# Patient Record
Sex: Female | Born: 1955
Health system: Southern US, Community
[De-identification: ages and names within clinical notes are randomized; demographics above are authoritative.]

## PROBLEM LIST (undated history)

## (undated) DIAGNOSIS — E119 Type 2 diabetes mellitus without complications: Secondary | ICD-10-CM

## (undated) DIAGNOSIS — N200 Calculus of kidney: Secondary | ICD-10-CM

## (undated) DIAGNOSIS — E785 Hyperlipidemia, unspecified: Secondary | ICD-10-CM

## (undated) HISTORY — PX: ABDOMINAL HYSTERECTOMY: SHX81

## (undated) HISTORY — DX: Calculus of kidney: N20.0

## (undated) HISTORY — DX: Type 2 diabetes mellitus without complications: E11.9

## (undated) HISTORY — DX: Hyperlipidemia, unspecified: E78.5

## (undated) HISTORY — PX: CYSTOSCOPY/RETROGRADE/URETEROSCOPY/STONE EXTRACTION WITH BASKET: SHX5317

## (undated) HISTORY — PX: ANTERIOR FUSION CERVICAL SPINE: SUR626

---

## 2014-03-18 ENCOUNTER — Ambulatory Visit: Payer: Self-pay | Admitting: Internal Medicine

## 2015-03-29 ENCOUNTER — Other Ambulatory Visit: Payer: Self-pay | Admitting: Family Medicine

## 2015-03-29 DIAGNOSIS — Z1231 Encounter for screening mammogram for malignant neoplasm of breast: Secondary | ICD-10-CM

## 2015-04-05 ENCOUNTER — Ambulatory Visit
Admission: RE | Admit: 2015-04-05 | Discharge: 2015-04-05 | Disposition: A | Discharge status: Home / Self Care | Payer: 59 | Source: Ambulatory Visit | Attending: Family Medicine | Admitting: Family Medicine

## 2015-04-05 DIAGNOSIS — Z1231 Encounter for screening mammogram for malignant neoplasm of breast: Secondary | ICD-10-CM | POA: Diagnosis not present

## 2016-04-17 ENCOUNTER — Other Ambulatory Visit: Payer: Self-pay | Admitting: Family Medicine

## 2016-04-17 DIAGNOSIS — Z1231 Encounter for screening mammogram for malignant neoplasm of breast: Secondary | ICD-10-CM

## 2016-05-04 ENCOUNTER — Ambulatory Visit
Admission: RE | Admit: 2016-05-04 | Discharge: 2016-05-04 | Disposition: A | Discharge status: Home / Self Care | Payer: Commercial Managed Care - HMO | Source: Ambulatory Visit | Attending: Family Medicine | Admitting: Family Medicine

## 2016-05-04 ENCOUNTER — Other Ambulatory Visit: Payer: Self-pay | Admitting: Family Medicine

## 2016-05-04 DIAGNOSIS — Z1231 Encounter for screening mammogram for malignant neoplasm of breast: Secondary | ICD-10-CM

## 2017-06-25 ENCOUNTER — Other Ambulatory Visit: Payer: Self-pay | Admitting: Family Medicine

## 2017-06-25 DIAGNOSIS — Z1231 Encounter for screening mammogram for malignant neoplasm of breast: Secondary | ICD-10-CM

## 2017-06-27 ENCOUNTER — Ambulatory Visit
Admission: RE | Admit: 2017-06-27 | Discharge: 2017-06-27 | Disposition: A | Discharge status: Home / Self Care | Payer: 59 | Source: Ambulatory Visit | Attending: Family Medicine | Admitting: Family Medicine

## 2017-06-27 DIAGNOSIS — Z1231 Encounter for screening mammogram for malignant neoplasm of breast: Secondary | ICD-10-CM | POA: Insufficient documentation

## 2018-02-06 ENCOUNTER — Ambulatory Visit: Payer: 59 | Admitting: *Deleted

## 2018-02-08 ENCOUNTER — Encounter: Payer: Commercial Managed Care - HMO | Attending: Family Medicine | Admitting: *Deleted

## 2018-02-08 ENCOUNTER — Encounter: Payer: Self-pay | Admitting: *Deleted

## 2018-02-08 VITALS — BP 102/70 | Ht 68.0 in | Wt 142.8 lb

## 2018-02-08 DIAGNOSIS — Z6821 Body mass index (BMI) 21.0-21.9, adult: Secondary | ICD-10-CM | POA: Insufficient documentation

## 2018-02-08 DIAGNOSIS — Z713 Dietary counseling and surveillance: Secondary | ICD-10-CM | POA: Insufficient documentation

## 2018-02-08 DIAGNOSIS — E119 Type 2 diabetes mellitus without complications: Secondary | ICD-10-CM | POA: Diagnosis not present

## 2018-02-08 NOTE — Patient Instructions (Signed)
Check blood sugars 2 x day before breakfast and 2 hrs after supper every day Bring blood sugar records to the next class  Exercise: Continue program for   30-60 minutes 6 days a week  Eat 3 meals day,  1-2  snacks a day Space meals 4-6 hours apart Wait 2-3 hours between meals and snacks  Return for classes on:

## 2018-02-08 NOTE — Progress Notes (Signed)
Diabetes Self-Management Education  Visit Type: First/Initial  Appt. Start Time: 1045 Appt. End Time: 1150  02/08/2018  [name], identified by name and date of birth, is a 62 y.o. female with a diagnosis of Diabetes: Type 2.   ASSESSMENT  Blood pressure 102/70, height 5\' 8"  (1.727 m), weight 142 lb 12.8 oz (64.8 kg). Body mass index is 21.71 kg/m.  Diabetes Self-Management Education - 02/08/18 1232      Visit Information   Visit Type  First/Initial      Initial Visit   Diabetes Type  Type 2    Are you currently following a meal plan?  Yes    What type of meal plan do you follow?  "less sweets and fried foods"    Are you taking your medications as prescribed?  Yes    Date Diagnosed  3 weeks ago      Health Coping   How would you rate your overall health?  Good      Psychosocial Assessment   Patient Belief/Attitude about Diabetes  Other (comment) "confused, somewhat anxious"    Self-care barriers  None    Self-management support  Doctor's office;Family    Patient Concerns  Nutrition/Meal planning;Glycemic Control;Medication;Monitoring;Healthy Lifestyle    Special Needs  None    Preferred Learning Style  Auditory;Visual    Learning Readiness  Change in progress    How often do you need to have someone help you when you read instructions, pamphlets, or other written materials from your doctor or pharmacy?  1 - Never    What is the last grade level you completed in school?  Doctorate      Pre-Education Assessment   Patient understands the diabetes disease and treatment process.  Needs Instruction    Patient understands incorporating nutritional management into lifestyle.  Needs Instruction    Patient undertands incorporating physical activity into lifestyle.  Needs Instruction    Patient understands using medications safely.  Needs Instruction    Patient understands monitoring blood glucose, interpreting and using results  Needs Review    Patient understands  prevention, detection, and treatment of acute complications.  Needs Instruction    Patient understands prevention, detection, and treatment of chronic complications.  Needs Instruction    Patient understands how to develop strategies to address psychosocial issues.  Needs Instruction    Patient understands how to develop strategies to promote health/change behavior.  Needs Instruction      Complications   Last HgB A1C per patient/outside source  12.3 % 01/17/18    How often do you check your blood sugar?  1-2 times/day    Fasting Blood glucose range (mg/dL)  54-098;119-147 Pt reports FBG's 118-132 mg/dL.     Postprandial Blood glucose range (mg/dL)  82-956;213-086 Pt reports pp's 95-150 mg/dL.     Have you had a dilated eye exam in the past 12 months?  Yes    Have you had a dental exam in the past 12 months?  Yes    Are you checking your feet?  Yes    How many days per week are you checking your feet?  7      Dietary Intake   Breakfast  egg whites, oatmeal, occasional bacon; cereal and milk    Lunch  salad with grilled chicken or 1/2 burger    Snack (afternoon)  nuts, fruit    Dinner  baked chicken or fish, sweet potato, pinto beans, corn, rice, pasta, greens, green beans, salads  Beverage(s)  water, unsweetened tea      Exercise   Exercise Type  Light (walking / raking leaves)    How many days per week to you exercise?  6    How many minutes per day do you exercise?  45    Total minutes per week of exercise  270      Patient Education   Previous Diabetes Education  No    Disease state   Definition of diabetes, type 1 and 2, and the diagnosis of diabetes;Factors that contribute to the development of diabetes    Nutrition management   Role of diet in the treatment of diabetes and the relationship between the three main macronutrients and blood glucose level;Reviewed blood glucose goals for pre and post meals and how to evaluate the patients' food intake on their blood glucose  level.;Food label reading, portion sizes and measuring food.    Physical activity and exercise   Role of exercise on diabetes management, blood pressure control and cardiac health.    Medications  Reviewed patients medication for diabetes, action, purpose, timing of dose and side effects.    Monitoring  Purpose and frequency of SMBG.;Taught/discussed recording of test results and interpretation of SMBG.;Identified appropriate SMBG and/or A1C goals.    Chronic complications  Relationship between chronic complications and blood glucose control;Retinopathy and reason for yearly dilated eye exams    Psychosocial adjustment  Identified and addressed patients feelings and concerns about diabetes      Individualized Goals (developed by patient)   Reducing Risk  Improve blood sugars Decrease medications Prevent diabetes complications Lead a healthier lifestyle Become more fit     Outcomes   Expected Outcomes  Demonstrated interest in learning. Expect positive outcomes    Future DMSE  4-6 wks       Individualized Plan for Diabetes Self-Management Training:   Learning Objective:  Patient will have a greater understanding of diabetes self-management. Patient education plan is to attend individual and/or group sessions per assessed needs and concerns.   Plan:   Patient Instructions  Check blood sugars 2 x day before breakfast and 2 hrs after supper every day Bring blood sugar records to the next class Exercise: Continue program for   30-60 minutes 6 days a week Eat 3 meals day,  1-2  snacks a day Space meals 4-6 hours apart Wait 2-3 hours between meals and snacks  Expected Outcomes:  Demonstrated interest in learning. Expect positive outcomes  Education material provided:  General Meal Planning Guidelines Simple Meal Plan  If problems or questions, patient to contact team via:   Sharion SettlerSheila Alaylah Heatherington, RN, CCM, CDE [phone]  Future DSME appointment: 4-6 wks  March 07, 2018 for  Diabetes Class 1

## 2018-03-07 ENCOUNTER — Encounter: Payer: 59 | Attending: Family Medicine | Admitting: Dietician

## 2018-03-07 ENCOUNTER — Encounter: Payer: Self-pay | Admitting: Dietician

## 2018-03-07 VITALS — Wt 140.0 lb

## 2018-03-07 DIAGNOSIS — E119 Type 2 diabetes mellitus without complications: Secondary | ICD-10-CM | POA: Insufficient documentation

## 2018-03-07 DIAGNOSIS — Z713 Dietary counseling and surveillance: Secondary | ICD-10-CM | POA: Insufficient documentation

## 2018-03-07 DIAGNOSIS — Z6821 Body mass index (BMI) 21.0-21.9, adult: Secondary | ICD-10-CM | POA: Diagnosis not present

## 2018-03-07 NOTE — Progress Notes (Signed)

## 2018-03-14 ENCOUNTER — Encounter: Payer: Self-pay | Admitting: *Deleted

## 2018-03-14 ENCOUNTER — Encounter: Payer: 59 | Attending: Family Medicine | Admitting: *Deleted

## 2018-03-14 VITALS — Wt 140.1 lb

## 2018-03-14 DIAGNOSIS — Z713 Dietary counseling and surveillance: Secondary | ICD-10-CM | POA: Insufficient documentation

## 2018-03-14 DIAGNOSIS — E119 Type 2 diabetes mellitus without complications: Secondary | ICD-10-CM | POA: Insufficient documentation

## 2018-03-14 DIAGNOSIS — Z6821 Body mass index (BMI) 21.0-21.9, adult: Secondary | ICD-10-CM | POA: Diagnosis not present

## 2018-03-14 NOTE — Progress Notes (Signed)
Appt. Start Time: 9:00  Appt. End Time: 11:00  Pt had dental appointment and left at 11:00 am  Class 2 Nutritional Management - identify sources of carbohydrate, protein and fat; plan balanced meals; estimate servings of carbohydrates in meals  Psychosocial - identify DM as a source of stress; state the effects of stress on BG control  Exercise - describe the effects of exercise on blood glucose and importance of regular exercise in controlling diabetes; state a plan for personal exercise; verbalize contraindications for exercise  Self-Monitoring - state importance of SMBG; use SMBG results to effectively manage diabetes; identify importance of regular HbA1C testing and goals for results  Acute Complications - recognize hyperglycemia and hypoglycemia with causes and effects; identify blood glucose results as high, low or in control; list steps in treating and preventing high and low blood glucose  Sick Day Guidelines: state appropriate measure to manage blood glucose when ill (need for meds, HBGM plan, when to call physician, need for fluids)  Chronic Complications/Foot, Skin, Eye Dental Care - identify possible long-term complications of diabetes (retinopathy, neuropathy, nephropathy, cardiovascular disease, infections); explain steps in prevention and treatment of chronic complications; state importance of daily self-foot exams; describe how to examine feet and what to look for; explain appropriate eye and dental care  Lifestyle Changes/Goals - state benefits of making appropriate lifestyle changes; identify habits that need to change (meals, tobacco, alcohol); identify strategies to reduce risk factors for personal health  Pregnancy/Sexual Health - state importance of good blood glucose control in preventing sexual problems (impotence, vaginal dryness, infections, loss of desire)  Teaching Materials Used: Class 2 Slide Packet A1C Pamphlet Foot Care Literature Kidney Test Handout Stroke  Card Quick and "Balanced" Meal Ideas Carb Counting and Meal Planning Book Goals for Class 2

## 2018-03-21 ENCOUNTER — Encounter: Payer: 59 | Admitting: Dietician

## 2018-03-21 ENCOUNTER — Encounter: Payer: Self-pay | Admitting: Dietician

## 2018-03-21 VITALS — BP 100/68 | Ht 68.0 in | Wt 142.7 lb

## 2018-03-21 DIAGNOSIS — Z713 Dietary counseling and surveillance: Secondary | ICD-10-CM | POA: Diagnosis not present

## 2018-03-21 DIAGNOSIS — E119 Type 2 diabetes mellitus without complications: Secondary | ICD-10-CM

## 2018-03-21 NOTE — Progress Notes (Signed)

## 2018-03-22 ENCOUNTER — Encounter: Payer: Self-pay | Admitting: *Deleted

## 2018-06-05 ENCOUNTER — Other Ambulatory Visit: Payer: Self-pay | Admitting: Family Medicine

## 2018-06-05 DIAGNOSIS — Z1231 Encounter for screening mammogram for malignant neoplasm of breast: Secondary | ICD-10-CM

## 2018-07-02 ENCOUNTER — Ambulatory Visit
Admission: RE | Admit: 2018-07-02 | Discharge: 2018-07-02 | Disposition: A | Discharge status: Home / Self Care | Payer: 59 | Source: Ambulatory Visit | Attending: Family Medicine | Admitting: Family Medicine

## 2018-07-02 DIAGNOSIS — Z1231 Encounter for screening mammogram for malignant neoplasm of breast: Secondary | ICD-10-CM | POA: Insufficient documentation

## 2019-03-30 IMAGING — MG MM DIGITAL SCREENING BILAT W/ TOMO W/ CAD
8 series · 9 of 24 positions shown · non-contrast
Comparison: Previous exam(s).

ACR Breast Density Category a: The breast tissue is almost entirely
fatty.

CLINICAL DATA: Screening.

EXAM:
DIGITAL SCREENING BILATERAL MAMMOGRAM WITH TOMO AND CAD

[R MLO synth-2D]
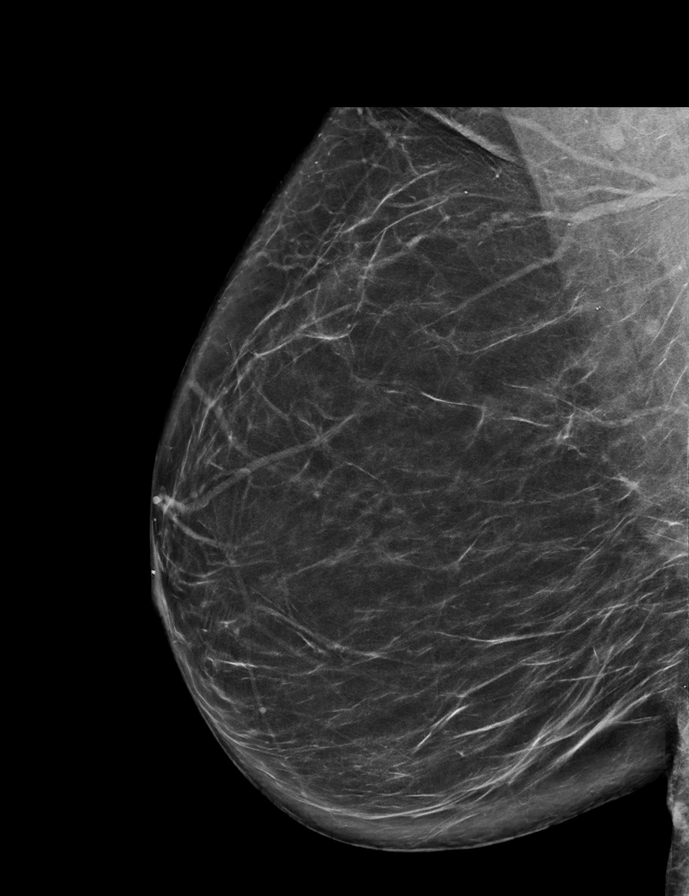

[L MLO synth-2D]
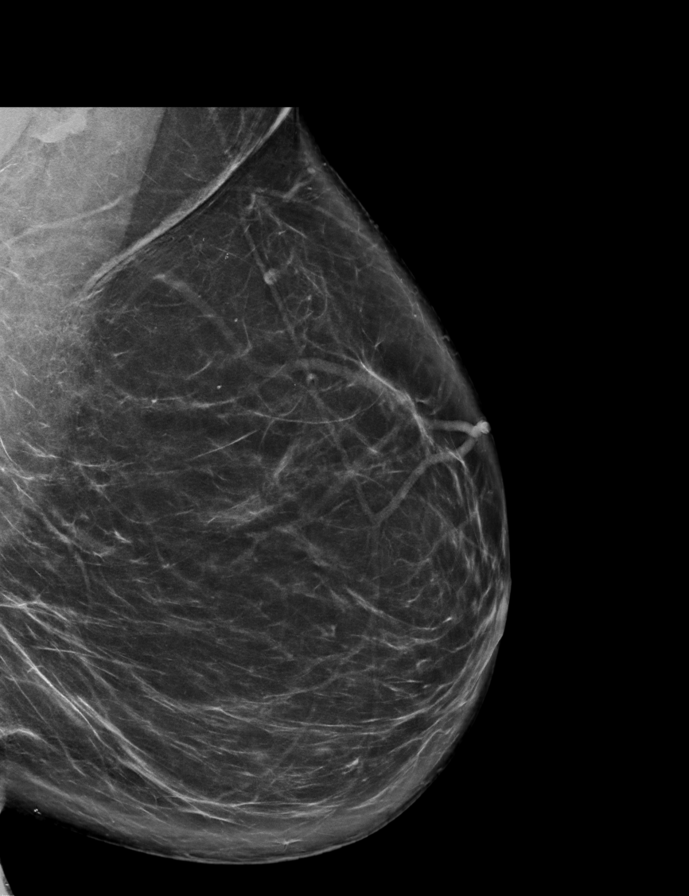

[L CC synth-2D]
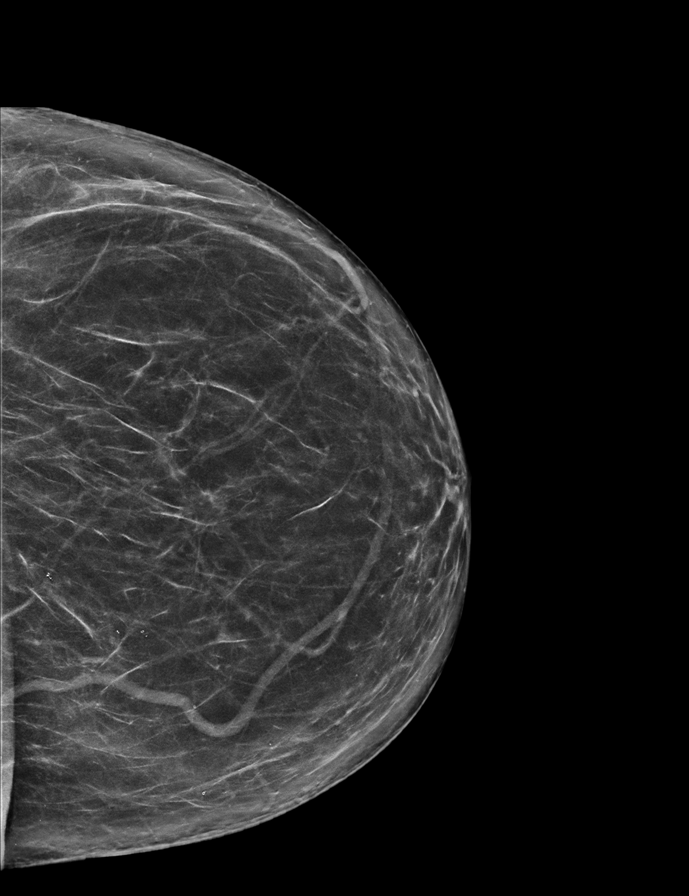

[R CC synth-2D]
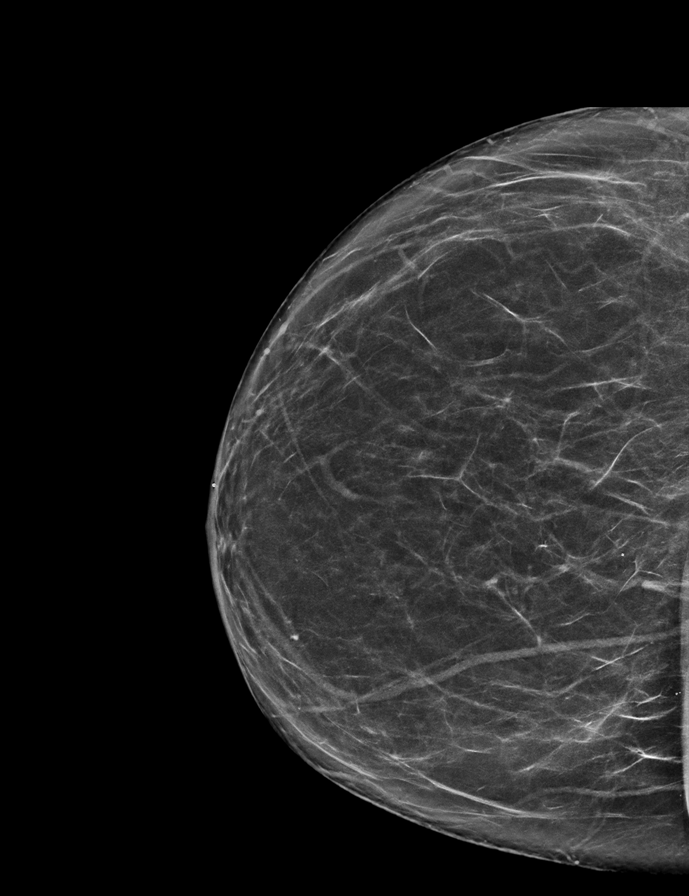

[L MLO tomo · 2 of 85 frames shown]
[frame 28/85]
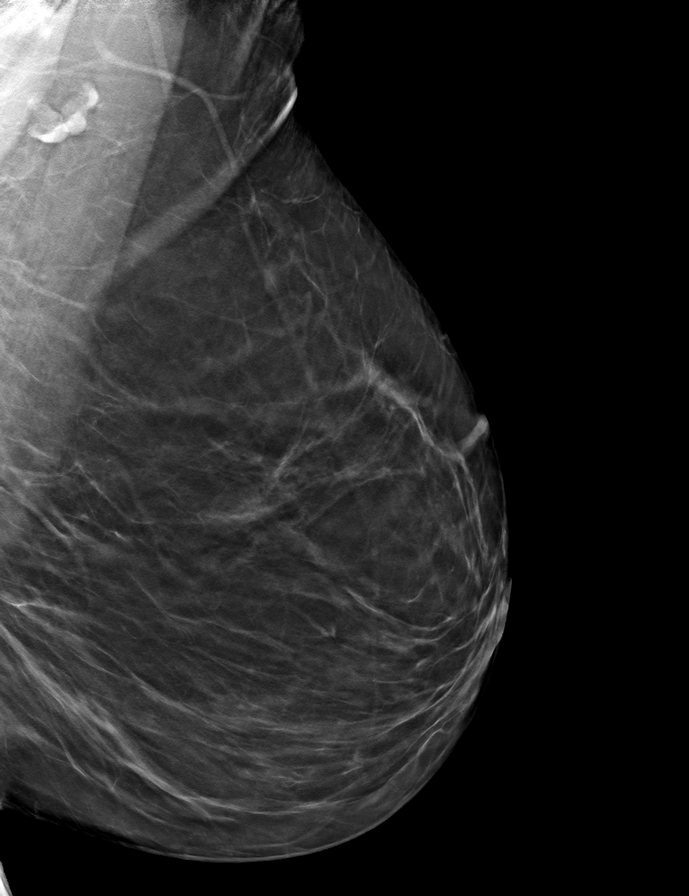
[frame 43/85]
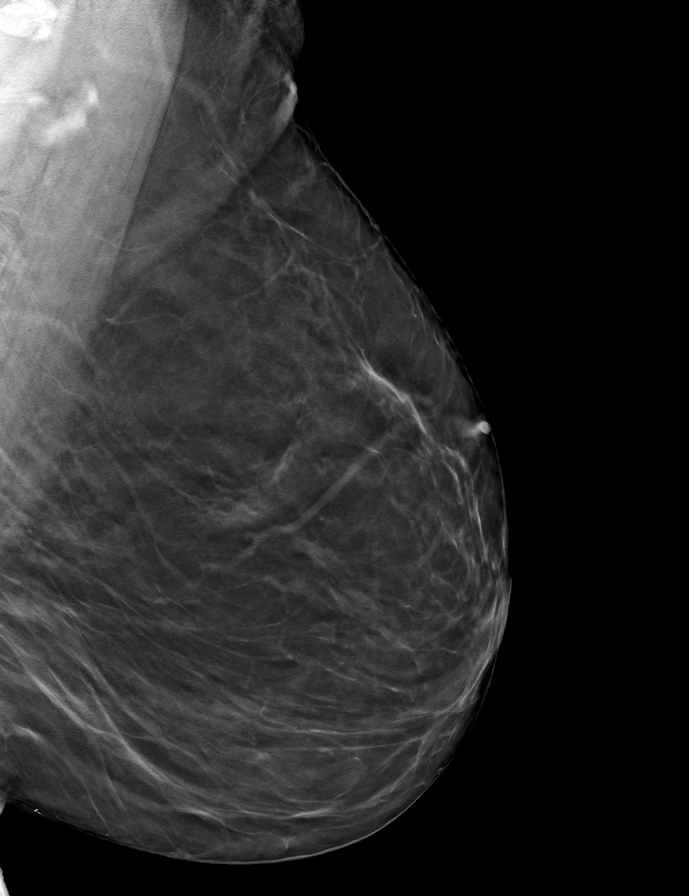

[L CC tomo · tomo slice 36/71.0]
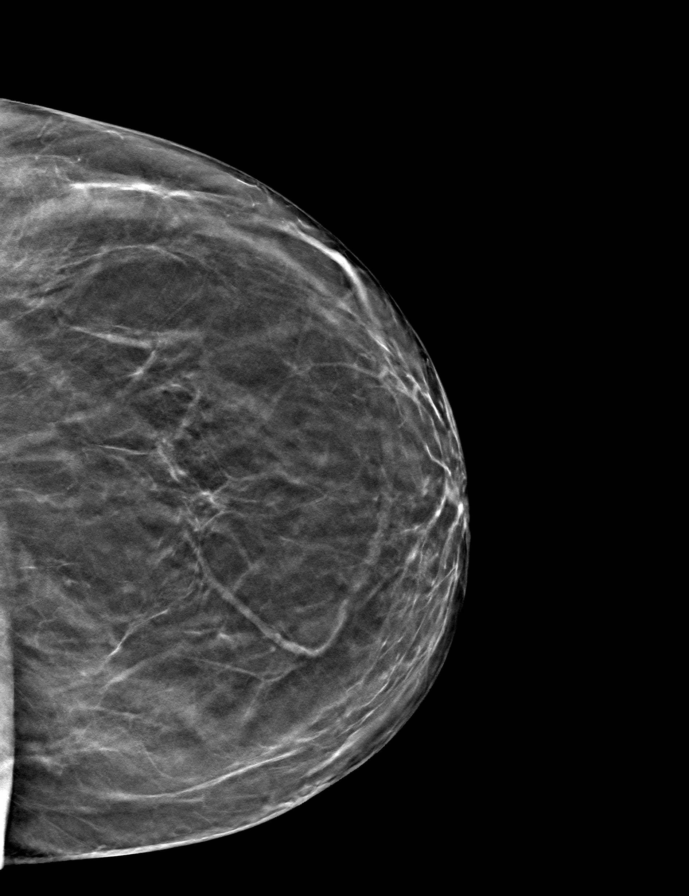

[R MLO tomo · tomo slice 39/76.0]
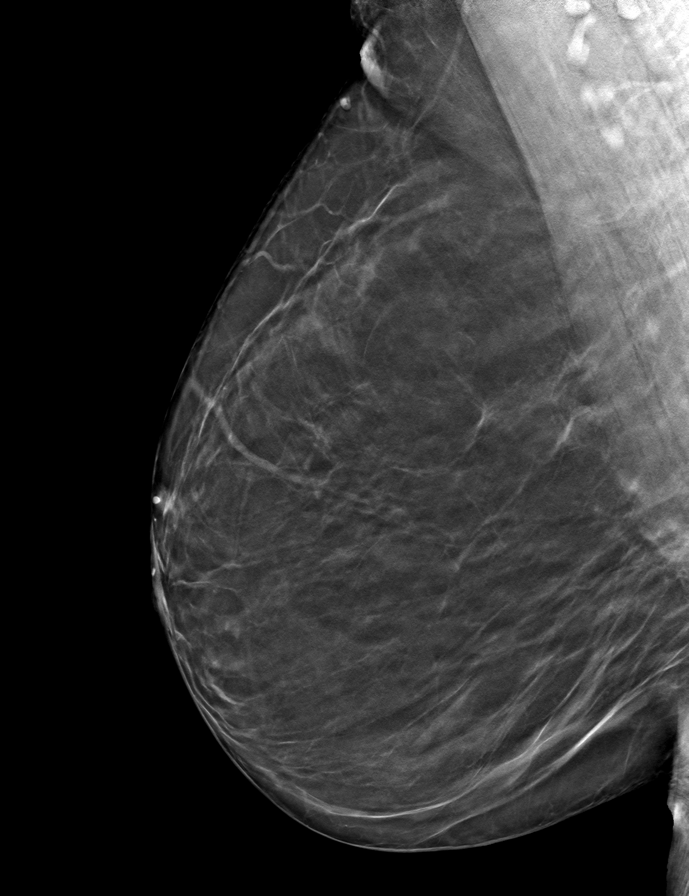

[R CC tomo · tomo slice 36/71.0]
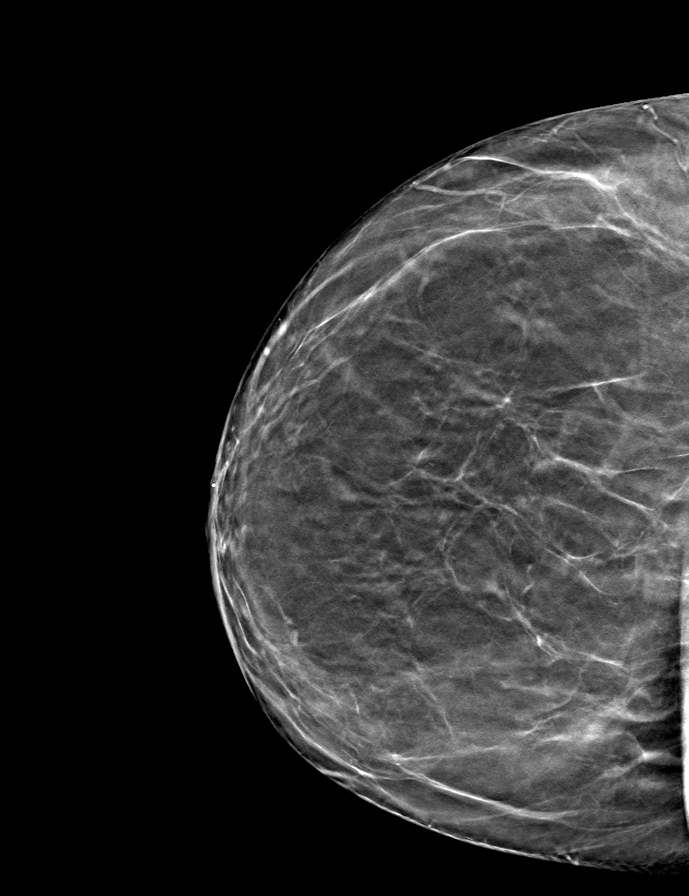

[9 of 24 positions shown; findings below may reference images not displayed]

FINDINGS: There are no findings suspicious for malignancy. Images were
processed with CAD.
IMPRESSION: No mammographic evidence of malignancy. A result letter of this
screening mammogram will be mailed directly to the patient.

RECOMMENDATION:
Screening mammogram in one year. (Code:[id number])

BI-RADS CATEGORY  1: Negative.

## 2019-06-16 ENCOUNTER — Other Ambulatory Visit: Payer: Self-pay | Admitting: Family Medicine

## 2019-06-16 DIAGNOSIS — Z1231 Encounter for screening mammogram for malignant neoplasm of breast: Secondary | ICD-10-CM

## 2019-07-23 ENCOUNTER — Ambulatory Visit
Admission: RE | Admit: 2019-07-23 | Discharge: 2019-07-23 | Disposition: A | Discharge status: Home / Self Care | Payer: 59 | Source: Ambulatory Visit | Attending: Family Medicine | Admitting: Family Medicine

## 2019-07-23 DIAGNOSIS — Z1231 Encounter for screening mammogram for malignant neoplasm of breast: Secondary | ICD-10-CM | POA: Diagnosis not present

## 2019-09-10 ENCOUNTER — Other Ambulatory Visit: Payer: Self-pay

## 2019-09-10 ENCOUNTER — Other Ambulatory Visit: Payer: Self-pay | Admitting: Family Medicine

## 2019-09-10 ENCOUNTER — Ambulatory Visit
Admission: RE | Admit: 2019-09-10 | Discharge: 2019-09-10 | Disposition: A | Discharge status: Home / Self Care | Payer: 59 | Attending: Family Medicine | Admitting: Family Medicine

## 2019-09-10 ENCOUNTER — Ambulatory Visit
Admission: RE | Admit: 2019-09-10 | Discharge: 2019-09-10 | Disposition: A | Discharge status: Home / Self Care | Payer: 59 | Source: Ambulatory Visit | Attending: Family Medicine | Admitting: Family Medicine

## 2019-09-10 DIAGNOSIS — M5417 Radiculopathy, lumbosacral region: Secondary | ICD-10-CM | POA: Diagnosis present

## 2020-01-11 ENCOUNTER — Ambulatory Visit: Payer: 59 | Attending: Internal Medicine

## 2020-01-11 DIAGNOSIS — Z23 Encounter for immunization: Secondary | ICD-10-CM

## 2020-01-11 NOTE — Progress Notes (Signed)
   Covid-19 Vaccination Clinic  Name:  Gabrielle West    MRN: [id number] DOB: May 29, 1956  01/11/2020  [name] was observed post Covid-19 immunization for 15 minutes without incidence. She was provided with Vaccine Information Sheet and instruction to access the V-Safe system.   [name] was instructed to call 911 with any severe reactions post vaccine: Marland Kitchen Difficulty breathing  . Swelling of your face and throat  . A fast heartbeat  . A bad rash all over your body  . Dizziness and weakness    Immunizations Administered    Name Date Dose VIS Date Route   Pfizer COVID-19 Vaccine 01/11/2020  4:35 PM 0.3 mL 10/24/2019 Intramuscular   Manufacturer: [employer], [employer]   Lot: [id number]   NDC: [address]-5

## 2020-02-03 ENCOUNTER — Ambulatory Visit: Payer: 59 | Attending: Internal Medicine

## 2020-02-03 DIAGNOSIS — Z23 Encounter for immunization: Secondary | ICD-10-CM

## 2020-02-03 NOTE — Progress Notes (Signed)
   Covid-19 Vaccination Clinic  Name:  Gabrielle West    MRN: [id number] DOB: Nov 01, 1956  02/03/2020  [name] was observed post Covid-19 immunization for 15 minutes without incident. She was provided with Vaccine Information Sheet and instruction to access the V-Safe system.   [name] was instructed to call 911 with any severe reactions post vaccine: Marland Kitchen Difficulty breathing  . Swelling of face and throat  . A fast heartbeat  . A bad rash all over body  . Dizziness and weakness   Immunizations Administered    Name Date Dose VIS Date Route   Pfizer COVID-19 Vaccine 02/03/2020  8:51 AM 0.3 mL 10/24/2019 Intramuscular   Manufacturer: [employer], [employer]   Lot: [id number]   NDC: [address]-9

## 2020-03-22 ENCOUNTER — Other Ambulatory Visit: Payer: Self-pay

## 2020-03-22 ENCOUNTER — Emergency Department
Admission: EM | Admit: 2020-03-22 | Discharge: 2020-03-22 | Disposition: A | Discharge status: Home / Self Care | Payer: 59 | Attending: Emergency Medicine | Admitting: Emergency Medicine

## 2020-03-22 ENCOUNTER — Emergency Department: Payer: 59

## 2020-03-22 DIAGNOSIS — R0602 Shortness of breath: Secondary | ICD-10-CM | POA: Insufficient documentation

## 2020-03-22 DIAGNOSIS — Z5321 Procedure and treatment not carried out due to patient leaving prior to being seen by health care provider: Secondary | ICD-10-CM | POA: Insufficient documentation

## 2020-03-22 DIAGNOSIS — R0789 Other chest pain: Secondary | ICD-10-CM | POA: Insufficient documentation

## 2020-03-22 LAB — TROPONIN I (HIGH SENSITIVITY): Troponin I (High Sensitivity): <2 ng/L (ref <18)

## 2020-03-22 LAB — BASIC METABOLIC PANEL
Anion gap: 7 (ref 5–15)
BUN: 17 mg/dL (ref 8–23)
CO2: 27 mmol/L (ref 22–32)
Calcium: 9.3 mg/dL (ref 8.9–10.3)
Chloride: 105 mmol/L (ref 98–111)
Creatinine, Ser: 0.67 mg/dL (ref 0.44–1.00)
GFR calc Af Amer: >60 mL/min (ref >60)
GFR calc non Af Amer: >60 mL/min (ref >60)
Glucose, Bld: 167 mg/dL — ABNORMAL HIGH (ref 70–99)
Potassium: 4.1 mmol/L (ref 3.5–5.1)
Sodium: 139 mmol/L (ref 135–145)

## 2020-03-22 LAB — CBC
HCT: 38.4 % (ref 36.0–46.0)
Hemoglobin: 12.7 g/dL (ref 12.0–15.0)
MCH: 29.5 pg (ref 26.0–34.0)
MCHC: 33.1 g/dL (ref 30.0–36.0)
MCV: 89.1 fL (ref 80.0–100.0)
Platelets: 174 10*3/uL (ref 150–400)
RBC: 4.31 MIL/uL (ref 3.87–5.11)
RDW: 12.2 % (ref 11.5–15.5)
WBC: 5.2 10*3/uL (ref 4.0–10.5)
nRBC: 0 % (ref 0.0–0.2)

## 2020-03-22 MED ORDER — SODIUM CHLORIDE 0.9% FLUSH: 3.0000 mL | Freq: Once | INTRAVENOUS | Status: DC

## 2020-03-22 NOTE — ED Triage Notes (Signed)
Pt has chest pain for 24 hours.  Pain in center of chest.    No n/v/d.  Pt has intermittent sob.  Pt alert  Speech clear.

## 2020-03-23 ENCOUNTER — Telehealth: Payer: Self-pay | Admitting: Emergency Medicine

## 2020-03-23 NOTE — Telephone Encounter (Signed)
Called patient due to lwot to inquire about condition and follow up plans. Left message.   

## 2020-06-01 ENCOUNTER — Other Ambulatory Visit: Payer: Self-pay | Admitting: Family Medicine

## 2020-06-01 DIAGNOSIS — Z1231 Encounter for screening mammogram for malignant neoplasm of breast: Secondary | ICD-10-CM

## 2020-09-03 ENCOUNTER — Ambulatory Visit
Admission: RE | Admit: 2020-09-03 | Discharge: 2020-09-03 | Disposition: A | Discharge status: Home / Self Care | Payer: 59 | Source: Ambulatory Visit | Attending: Family Medicine | Admitting: Family Medicine

## 2020-09-03 ENCOUNTER — Other Ambulatory Visit: Payer: Self-pay

## 2020-09-03 DIAGNOSIS — Z1231 Encounter for screening mammogram for malignant neoplasm of breast: Secondary | ICD-10-CM | POA: Insufficient documentation

## 2021-05-03 ENCOUNTER — Ambulatory Visit: Payer: 59 | Admitting: Podiatry

## 2021-05-03 ENCOUNTER — Other Ambulatory Visit: Payer: Self-pay

## 2021-05-03 ENCOUNTER — Encounter: Payer: Self-pay | Admitting: Podiatry

## 2021-05-03 ENCOUNTER — Ambulatory Visit (INDEPENDENT_AMBULATORY_CARE_PROVIDER_SITE_OTHER): Payer: 59

## 2021-05-03 DIAGNOSIS — M722 Plantar fascial fibromatosis: Secondary | ICD-10-CM | POA: Diagnosis not present

## 2021-05-03 MED ORDER — MELOXICAM 15 MG PO TABS: 15.0000 mg | ORAL_TABLET | Freq: Every day | ORAL | 3 refills | Status: DC

## 2021-05-03 NOTE — Patient Instructions (Addendum)
Look for urea 40% with salicylic acid cream or ointment and apply to the thickened dry skin / calluses. This can be bought over the counter, at a pharmacy or online such as [employer].   Plantar Fasciitis (Heel Spur Syndrome) with Rehab The plantar fascia is a fibrous, ligament-like, soft-tissue structure that spans the bottom of the foot. Plantar fasciitis is a condition that causes pain in the foot due to inflammation of the tissue. SYMPTOMS  Pain and tenderness on the underneath side of the foot. Pain that worsens with standing or walking. CAUSES  Plantar fasciitis is caused by irritation and injury to the plantar fascia on the underneath side of the foot. Common mechanisms of injury include: Direct trauma to bottom of the foot. Damage to a small nerve that runs under the foot where the main fascia attaches to the heel bone. Stress placed on the plantar fascia due to bone spurs. RISK INCREASES WITH:  Activities that place stress on the plantar fascia (running, jumping, pivoting, or cutting). Poor strength and flexibility. Improperly fitted shoes. Tight calf muscles. Flat feet. Failure to warm-up properly before activity. Obesity. PREVENTION Warm up and stretch properly before activity. Allow for adequate recovery between workouts. Maintain physical fitness: Strength, flexibility, and endurance. Cardiovascular fitness. Maintain a health body weight. Avoid stress on the plantar fascia. Wear properly fitted shoes, including arch supports for individuals who have flat feet.  PROGNOSIS  If treated properly, then the symptoms of plantar fasciitis usually resolve without surgery. However, occasionally surgery is necessary.  RELATED COMPLICATIONS  Recurrent symptoms that may result in a chronic condition. Problems of the lower back that are caused by compensating for the injury, such as limping. Pain or weakness of the foot during push-off following surgery. Chronic inflammation,  scarring, and partial or complete fascia tear, occurring more often from repeated injections.  TREATMENT  Treatment initially involves the use of ice and medication to help reduce pain and inflammation. The use of strengthening and stretching exercises may help reduce pain with activity, especially stretches of the Achilles tendon. These exercises may be performed at home or with a therapist. Your caregiver may recommend that you use heel cups of arch supports to help reduce stress on the plantar fascia. Occasionally, corticosteroid injections are given to reduce inflammation. If symptoms persist for greater than 6 months despite non-surgical (conservative), then surgery may be recommended.   MEDICATION  If pain medication is necessary, then nonsteroidal anti-inflammatory medications, such as aspirin and ibuprofen, or other minor pain relievers, such as acetaminophen, are often recommended. Do not take pain medication within 7 days before surgery. Prescription pain relievers may be given if deemed necessary by your caregiver. Use only as directed and only as much as you need. Corticosteroid injections may be given by your caregiver. These injections should be reserved for the most serious cases, because they may only be given a certain number of times.  HEAT AND COLD Cold treatment (icing) relieves pain and reduces inflammation. Cold treatment should be applied for 10 to 15 minutes every 2 to 3 hours for inflammation and pain and immediately after any activity that aggravates your symptoms. Use ice packs or massage the area with a piece of ice (ice massage). Heat treatment may be used prior to performing the stretching and strengthening activities prescribed by your caregiver, physical therapist, or athletic trainer. Use a heat pack or soak the injury in warm water.  SEEK IMMEDIATE MEDICAL CARE IF: Treatment seems to offer no benefit, or the  condition worsens. Any medications produce adverse side  effects.  EXERCISES- RANGE OF MOTION (ROM) AND STRETCHING EXERCISES - Plantar Fasciitis (Heel Spur Syndrome) These exercises may help you when beginning to rehabilitate your injury. Your symptoms may resolve with or without further involvement from your physician, physical therapist or athletic trainer. While completing these exercises, remember:  Restoring tissue flexibility helps normal motion to return to the joints. This allows healthier, less painful movement and activity. An effective stretch should be held for at least 30 seconds. A stretch should never be painful. You should only feel a gentle lengthening or release in the stretched tissue.  RANGE OF MOTION - Toe Extension, Flexion Sit with your right / left leg crossed over your opposite knee. Grasp your toes and gently pull them back toward the top of your foot. You should feel a stretch on the bottom of your toes and/or foot. Hold this stretch for 10 seconds. Now, gently pull your toes toward the bottom of your foot. You should feel a stretch on the top of your toes and or foot. Hold this stretch for 10 seconds. Repeat  times. Complete this stretch 3 times per day.   RANGE OF MOTION - Ankle Dorsiflexion, Active Assisted Remove shoes and sit on a chair that is preferably not on a carpeted surface. Place right / left foot under knee. Extend your opposite leg for support. Keeping your heel down, slide your right / left foot back toward the chair until you feel a stretch at your ankle or calf. If you do not feel a stretch, slide your bottom forward to the edge of the chair, while still keeping your heel down. Hold this stretch for 10 seconds. Repeat 3 times. Complete this stretch 2 times per day.   STRETCH  Gastroc, Standing Place hands on wall. Extend right / left leg, keeping the front knee somewhat bent. Slightly point your toes inward on your back foot. Keeping your right / left heel on the floor and your knee straight, shift  your weight toward the wall, not allowing your back to arch. You should feel a gentle stretch in the right / left calf. Hold this position for 10 seconds. Repeat 3 times. Complete this stretch 2 times per day.  STRETCH  Soleus, Standing Place hands on wall. Extend right / left leg, keeping the other knee somewhat bent. Slightly point your toes inward on your back foot. Keep your right / left heel on the floor, bend your back knee, and slightly shift your weight over the back leg so that you feel a gentle stretch deep in your back calf. Hold this position for 10 seconds. Repeat 3 times. Complete this stretch 2 times per day.  STRETCH  Gastrocsoleus, Standing  Note: This exercise can place a lot of stress on your foot and ankle. Please complete this exercise only if specifically instructed by your caregiver.  Place the ball of your right / left foot on a step, keeping your other foot firmly on the same step. Hold on to the wall or a rail for balance. Slowly lift your other foot, allowing your body weight to press your heel down over the edge of the step. You should feel a stretch in your right / left calf. Hold this position for 10 seconds. Repeat this exercise with a slight bend in your right / left knee. Repeat 3 times. Complete this stretch 2 times per day.   STRENGTHENING EXERCISES - Plantar Fasciitis (Heel Spur Syndrome)  These  exercises may help you when beginning to rehabilitate your injury. They may resolve your symptoms with or without further involvement from your physician, physical therapist or athletic trainer. While completing these exercises, remember:  Muscles can gain both the endurance and the strength needed for everyday activities through controlled exercises. Complete these exercises as instructed by your physician, physical therapist or athletic trainer. Progress the resistance and repetitions only as guided.  STRENGTH - Towel Curls Sit in a chair positioned on a  non-carpeted surface. Place your foot on a towel, keeping your heel on the floor. Pull the towel toward your heel by only curling your toes. Keep your heel on the floor. Repeat 3 times. Complete this exercise 2 times per day.  STRENGTH - Ankle Inversion Secure one end of a rubber exercise band/tubing to a fixed object (table, pole). Loop the other end around your foot just before your toes. Place your fists between your knees. This will focus your strengthening at your ankle. Slowly, pull your big toe up and in, making sure the band/tubing is positioned to resist the entire motion. Hold this position for 10 seconds. Have your muscles resist the band/tubing as it slowly pulls your foot back to the starting position. Repeat 3 times. Complete this exercises 2 times per day.  Document Released: 10/30/2005 Document Revised: 01/22/2012 Document Reviewed: 02/11/2009 [facility] Patient Information 2014 [place], [place].

## 2021-05-05 NOTE — Progress Notes (Signed)
  Subjective:  Patient ID: Gabrielle West, female    DOB: [birth year],  MRN: [id number]  Chief Complaint  Patient presents with   Foot Pain    Bilateral foot pain located at the heel of both feet x 1 month. Sharps pain without numbness or tingling.     65 y.o. female presents with the above complaint. History confirmed with patient.  The right side is much worse  Objective:  Physical Exam: warm, good capillary refill, no trophic changes or ulcerative lesions, normal DP and PT pulses, and normal sensory exam. Left Foot: point tenderness over the heel pad  Right Foot: point tenderness over the heel pad   No images are attached to the encounter.  Radiographs: Multiple views x-ray of both feet: no fracture, dislocation, swelling or degenerative changes noted and plantar calcaneal spur Assessment:   1. Plantar fasciitis, bilateral      Plan:  Patient was evaluated and treated and all questions answered.  Discussed the etiology and treatment options for plantar fasciitis including stretching, formal physical therapy, supportive shoegears such as a running shoe or sneaker, pre fabricated orthoses, injection therapy, and oral medications. We also discussed the role of surgical treatment of this for patients who do not improve after exhausting non-surgical treatment options.   -XR reviewed with patient -Educated patient on stretching and icing of the affected limb -Night splint dispensed -Plantar fascial brace dispensed -Injection delivered to the plantar fascia of the right foot. -Rx for meloxicam. Educated on use, risks and benefits of the medication   Return in about 1 month (around 06/02/2021) for recheck plantar fasciitis.

## 2021-06-02 ENCOUNTER — Other Ambulatory Visit: Payer: Self-pay

## 2021-06-02 ENCOUNTER — Ambulatory Visit: Payer: 59 | Admitting: Podiatry

## 2021-06-02 DIAGNOSIS — M722 Plantar fascial fibromatosis: Secondary | ICD-10-CM

## 2021-06-02 MED ORDER — METHYLPREDNISOLONE 4 MG PO TBPK: ORAL_TABLET | ORAL | 0 refills | Status: AC

## 2021-06-02 NOTE — Patient Instructions (Signed)
Look for Tuli's heel cups on [employer] for cushioning    Look for an "EvenUp" shoe attachment on [employer] or at [employer]. This will level out your hips while you are walking in the CAM boot. Wear this on the other foot around a supportive sneaker:

## 2021-06-06 ENCOUNTER — Telehealth: Payer: Self-pay

## 2021-06-06 NOTE — Telephone Encounter (Signed)
FYI: Patient in office today stating that her short CAM boot feels too big. Patient was wearing a large.Had patient try on a tall medium CAM boot which turned out to be a much better fit. Patient was satisfied with the fit of the medium and exchanged it.

## 2021-06-06 NOTE — Telephone Encounter (Signed)
OK - thanks

## 2021-06-07 NOTE — Progress Notes (Signed)
  Subjective:  Patient ID: Gabrielle West, female    DOB: 1956/01/11,  MRN: [id number]  Chief Complaint  Patient presents with   Plantar Fasciitis    1 month follow up  for recheck plantar fasciitis. bilateral    65 y.o. female presents with the above complaint. History confirmed with patient.  Was doing better for a while but has come back.  Objective:  Physical Exam: warm, good capillary refill, no trophic changes or ulcerative lesions, normal DP and PT pulses, and normal sensory exam. Left Foot: Minimal pain on the side Right Foot: point tenderness over the heel pad   No images are attached to the encounter.  Radiographs: Multiple views x-ray of both feet: no fracture, dislocation, swelling or degenerative changes noted and plantar calcaneal spur Assessment:   1. Plantar fasciitis of right foot       Plan:  Patient was evaluated and treated and all questions answered.  Discussed the etiology and treatment options for plantar fasciitis including stretching, formal physical therapy, supportive shoegears such as a running shoe or sneaker, pre fabricated orthoses, injection therapy, and oral medications. We also discussed the role of surgical treatment of this for patients who do not improve after exhausting non-surgical treatment options.  Recommend immobilization in a CAM boot this was dispensed today Methylprednisolone taper sent to pharmacy    Return in about 1 month (around 07/03/2021) for recheck plantar fasciitis.

## 2021-06-29 ENCOUNTER — Other Ambulatory Visit: Payer: Self-pay | Admitting: Family Medicine

## 2021-06-29 DIAGNOSIS — Z0000 Encounter for general adult medical examination without abnormal findings: Secondary | ICD-10-CM

## 2021-06-29 DIAGNOSIS — Z1231 Encounter for screening mammogram for malignant neoplasm of breast: Secondary | ICD-10-CM

## 2021-07-01 DIAGNOSIS — L309 Dermatitis, unspecified: Secondary | ICD-10-CM | POA: Diagnosis not present

## 2021-07-07 ENCOUNTER — Other Ambulatory Visit: Payer: Self-pay

## 2021-07-07 ENCOUNTER — Ambulatory Visit: Payer: 59 | Admitting: Podiatry

## 2021-07-07 DIAGNOSIS — M722 Plantar fascial fibromatosis: Secondary | ICD-10-CM

## 2021-07-11 NOTE — Progress Notes (Signed)
  Subjective:  Patient ID: Gabrielle West, female    DOB: [birth year],  MRN: [id number]  Chief Complaint  Patient presents with   Plantar Fasciitis    4 week follow up right foot    65 y.o. female presents with the above complaint. History confirmed with patient.  Doing much better after being in the boot  Objective:  Physical Exam: warm, good capillary refill, no trophic changes or ulcerative lesions, normal DP and PT pulses, and normal sensory exam. Left Foot: Minimal pain on the side Right Foot: point tenderness over the heel pad   No images are attached to the encounter.  Radiographs: Multiple views x-ray of both feet: no fracture, dislocation, swelling or degenerative changes noted and plantar calcaneal spur Assessment:   No diagnosis found.     Plan:  Patient was evaluated and treated and all questions answered.  Discussed the etiology and treatment options for plantar fasciitis including stretching, formal physical therapy, supportive shoegears such as a running shoe or sneaker, pre fabricated orthoses, injection therapy, and oral medications. We also discussed the role of surgical treatment of this for patients who do not improve after exhausting non-surgical treatment options.   Continues to make good progress I reviewed recommended a repeat injection today and this was performed as per below.  She can resume regular shoe gear and activity as think this will eliminate this with the rest of her home therapy.  Return to see me as needed for this or other issues  After sterile prep with povidone-iodine solution and alcohol, the right heel was injected with 0.5cc 2% xylocaine plain, 0.5cc 0.5% marcaine plain, 5mg  triamcinolone acetonide, and 2mg  dexamethasone was injected along the medial plantar fascia at the insertion on the plantar calcaneus. The patient tolerated the procedure well without complication.     Return if symptoms worsen or fail to improve.

## 2021-08-05 ENCOUNTER — Ambulatory Visit (INDEPENDENT_AMBULATORY_CARE_PROVIDER_SITE_OTHER): Payer: 59 | Admitting: Family Medicine

## 2021-08-05 ENCOUNTER — Encounter: Payer: Self-pay | Admitting: Family Medicine

## 2021-08-05 DIAGNOSIS — M722 Plantar fascial fibromatosis: Secondary | ICD-10-CM

## 2021-08-05 NOTE — Assessment & Plan Note (Signed)
Has already had 2 injections with another provider.  Would like to avoid further injections if I could.  Today we discussed origin of plantar fasciitis and the meaning of a heel spur.  Placed her in green insoles with scaphoid pads.  We discussed potentially adding nitroglycerin patch therapy and she decided to wait till I see her back in 2 to 3 weeks for follow-up.  If she is having significant improvement at that time we may not need to move forward with that.  We also briefly discussed custom molded orthotics.

## 2021-08-05 NOTE — Progress Notes (Signed)
  Gabrielle West - 65 y.o. female MRN [id number]  Date of birth: 04/05/1956    SUBJECTIVE:      Chief Complaint:/ HPI:  Bilateral but right greater than left foot pain.  Has been seeing a podiatrist and he gave her 2 injections in the right foot for plantar fasciitis.  That helped some but still has a lot of pain in the morning.  Initially the left foot was not bothering her now she started to have some pain there.    OBJECTIVE: Ht 5\' 8"  (1.727 m)   Wt 149 lb (67.6 kg)   BMI 22.66 kg/m   Physical Exam:  Vital signs are reviewed. GENERAL: Well-developed female no acute distress FEET: High arched feet.  Tenderness to palpation over the origin of the plantar fascia on the right foot.  Otherwise normal foot exam  ASSESSMENT & PLAN:  See problem based charting & AVS for pt instructions. Plantar fasciitis Has already had 2 injections with another provider.  Would like to avoid further injections if I could.  Today we discussed origin of plantar fasciitis and the meaning of a heel spur.  Placed her in green insoles with scaphoid pads.  We discussed potentially adding nitroglycerin patch therapy and she decided to wait till I see her back in 2 to 3 weeks for follow-up.  If she is having significant improvement at that time we may not need to move forward with that.  We also briefly discussed custom molded orthotics.

## 2021-09-12 ENCOUNTER — Ambulatory Visit
Admission: RE | Admit: 2021-09-12 | Discharge: 2021-09-12 | Disposition: A | Discharge status: Home / Self Care | Payer: 59 | Source: Ambulatory Visit | Attending: Family Medicine | Admitting: Family Medicine

## 2021-09-12 ENCOUNTER — Other Ambulatory Visit: Payer: Self-pay

## 2021-09-12 DIAGNOSIS — Z0000 Encounter for general adult medical examination without abnormal findings: Secondary | ICD-10-CM | POA: Diagnosis not present

## 2021-09-12 DIAGNOSIS — Z1231 Encounter for screening mammogram for malignant neoplasm of breast: Secondary | ICD-10-CM

## 2021-09-12 DIAGNOSIS — M85851 Other specified disorders of bone density and structure, right thigh: Secondary | ICD-10-CM | POA: Diagnosis not present

## 2021-09-12 DIAGNOSIS — Z780 Asymptomatic menopausal state: Secondary | ICD-10-CM | POA: Diagnosis not present

## 2021-10-05 DIAGNOSIS — R0982 Postnasal drip: Secondary | ICD-10-CM | POA: Diagnosis not present

## 2021-10-05 DIAGNOSIS — J9801 Acute bronchospasm: Secondary | ICD-10-CM | POA: Diagnosis not present

## 2022-01-20 ENCOUNTER — Other Ambulatory Visit: Payer: Self-pay | Admitting: Family Medicine

## 2022-01-20 ENCOUNTER — Ambulatory Visit: Payer: Self-pay

## 2022-01-20 ENCOUNTER — Other Ambulatory Visit: Payer: Self-pay

## 2022-01-20 DIAGNOSIS — M25562 Pain in left knee: Secondary | ICD-10-CM

## 2022-09-28 ENCOUNTER — Other Ambulatory Visit: Payer: Self-pay

## 2022-09-28 DIAGNOSIS — Z1231 Encounter for screening mammogram for malignant neoplasm of breast: Secondary | ICD-10-CM

## 2024-02-27 ENCOUNTER — Other Ambulatory Visit: Payer: Self-pay | Admitting: Family Medicine

## 2024-02-27 DIAGNOSIS — Z1231 Encounter for screening mammogram for malignant neoplasm of breast: Secondary | ICD-10-CM
